# Patient Record
Sex: Female | Born: 1945 | Race: White | Hispanic: No
Health system: Southern US, Community
[De-identification: ages and names within clinical notes are randomized; demographics above are authoritative.]

---

## 2019-12-05 ENCOUNTER — Ambulatory Visit: Payer: Self-pay

## 2019-12-19 ENCOUNTER — Ambulatory Visit: Payer: Self-pay

## 2019-12-22 ENCOUNTER — Other Ambulatory Visit: Payer: Self-pay

## 2019-12-22 ENCOUNTER — Ambulatory Visit: Payer: Medicare Other | Attending: Internal Medicine

## 2019-12-22 DIAGNOSIS — Z23 Encounter for immunization: Secondary | ICD-10-CM

## 2019-12-22 NOTE — Progress Notes (Signed)
   Covid-19 Vaccination Clinic  Name:  Amy Wallace    MRN: [id number] DOB: [birth year]  12/22/2019  [name] was observed post Covid-19 immunization for 15 minutes without incident. She was provided with Vaccine Information Sheet and instruction to access the V-Safe system.   [name] was instructed to call 911 with any severe reactions post vaccine: Marland Kitchen Difficulty breathing  . Swelling of face and throat  . A fast heartbeat  . A bad rash all over body  . Dizziness and weakness   Immunizations Administered    Name Date Dose VIS Date Route   Pfizer COVID-19 Vaccine 12/22/2019 10:33 AM 0.3 mL 08/19/2019 Intramuscular   Manufacturer: [employer], [employer]   Lot: [id number]   NDC: [address]-9

## 2020-01-18 ENCOUNTER — Ambulatory Visit: Payer: Medicare Other

## 2020-01-18 ENCOUNTER — Ambulatory Visit: Payer: Medicare Other | Attending: Internal Medicine

## 2020-01-18 DIAGNOSIS — Z23 Encounter for immunization: Secondary | ICD-10-CM

## 2020-01-18 NOTE — Progress Notes (Signed)
   Covid-19 Vaccination Clinic  Name:  Amy Wallace    MRN: [id number] DOB: 11-01-1945  01/18/2020  [name] was observed post Covid-19 immunization for 15 minutes without incident. She was provided with Vaccine Information Sheet and instruction to access the V-Safe system.   [name] was instructed to call 911 with any severe reactions post vaccine: Marland Kitchen Difficulty breathing  . Swelling of face and throat  . A fast heartbeat  . A bad rash all over body  . Dizziness and weakness   Immunizations Administered    Name Date Dose VIS Date Route   Pfizer COVID-19 Vaccine 01/18/2020  2:17 PM 0.3 mL 11/02/2018 Intramuscular   Manufacturer: [employer], [employer]   Lot: [id number]   NDC: [address]-1

## 2020-06-05 ENCOUNTER — Other Ambulatory Visit: Payer: Self-pay | Admitting: Internal Medicine

## 2020-06-05 DIAGNOSIS — Z1231 Encounter for screening mammogram for malignant neoplasm of breast: Secondary | ICD-10-CM

## 2020-08-17 ENCOUNTER — Other Ambulatory Visit: Payer: Self-pay

## 2020-08-17 ENCOUNTER — Ambulatory Visit
Admission: RE | Admit: 2020-08-17 | Discharge: 2020-08-17 | Disposition: A | Discharge status: Home / Self Care | Payer: Medicare Other | Source: Ambulatory Visit | Attending: Internal Medicine | Admitting: Internal Medicine

## 2020-08-17 DIAGNOSIS — Z1231 Encounter for screening mammogram for malignant neoplasm of breast: Secondary | ICD-10-CM | POA: Diagnosis present

## 2021-05-27 ENCOUNTER — Other Ambulatory Visit: Payer: Self-pay | Admitting: Internal Medicine

## 2021-05-27 DIAGNOSIS — Z1231 Encounter for screening mammogram for malignant neoplasm of breast: Secondary | ICD-10-CM

## 2021-08-19 ENCOUNTER — Ambulatory Visit
Admission: RE | Admit: 2021-08-19 | Discharge: 2021-08-19 | Disposition: A | Discharge status: Home / Self Care | Payer: Medicare Other | Source: Ambulatory Visit | Attending: Internal Medicine | Admitting: Internal Medicine

## 2021-08-19 ENCOUNTER — Other Ambulatory Visit: Payer: Self-pay

## 2021-08-19 DIAGNOSIS — Z1231 Encounter for screening mammogram for malignant neoplasm of breast: Secondary | ICD-10-CM | POA: Diagnosis not present

## 2022-07-14 ENCOUNTER — Other Ambulatory Visit: Payer: Self-pay | Admitting: Internal Medicine

## 2022-07-14 DIAGNOSIS — Z1231 Encounter for screening mammogram for malignant neoplasm of breast: Secondary | ICD-10-CM

## 2022-08-27 ENCOUNTER — Ambulatory Visit
Admission: RE | Admit: 2022-08-27 | Discharge: 2022-08-27 | Disposition: A | Discharge status: Home / Self Care | Payer: Medicare Other | Source: Ambulatory Visit | Attending: Internal Medicine | Admitting: Internal Medicine

## 2022-08-27 DIAGNOSIS — Z1231 Encounter for screening mammogram for malignant neoplasm of breast: Secondary | ICD-10-CM | POA: Diagnosis present

## 2023-03-19 IMAGING — MG MM DIGITAL SCREENING BILAT W/ TOMO AND CAD
8 series · 9 of 24 positions shown · non-contrast
Comparison: Previous exam(s).

CLINICAL DATA: Screening.

EXAM:
DIGITAL SCREENING BILATERAL MAMMOGRAM WITH TOMOSYNTHESIS AND CAD
TECHNIQUE: Bilateral screening digital craniocaudal and mediolateral oblique
mammograms were obtained. Bilateral screening digital breast
tomosynthesis was performed. The images were evaluated with
computer-aided detection.

[R MLO synth-2D]
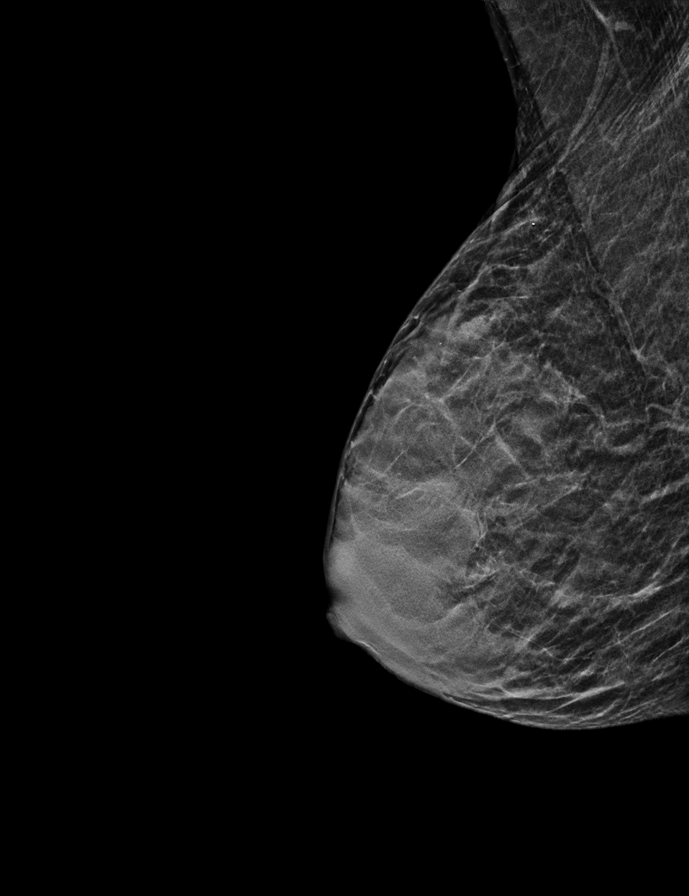

[L MLO synth-2D]
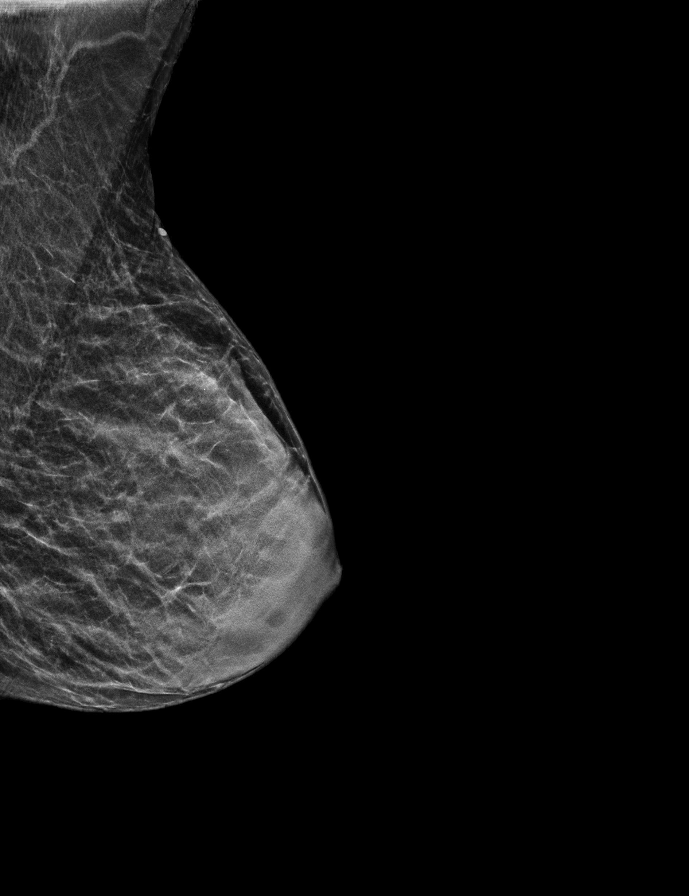

[R CC synth-2D]
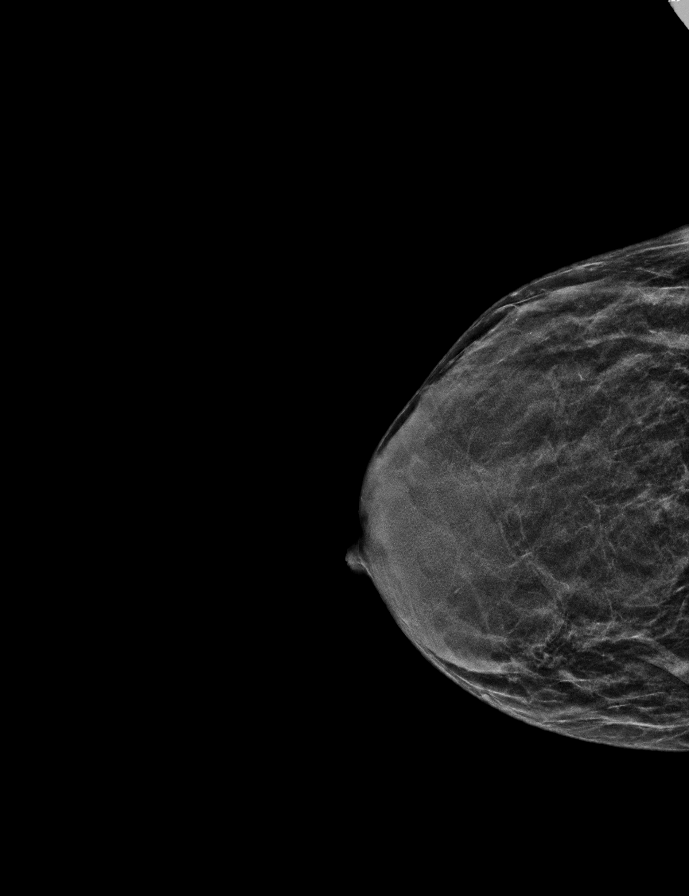

[L CC synth-2D]
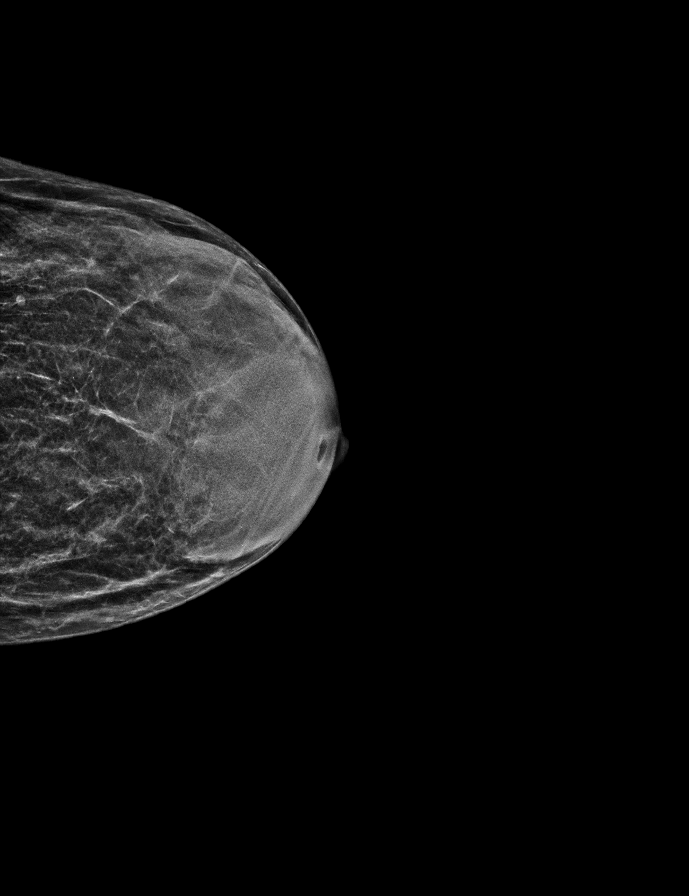

[L CC tomo · 2 of 30 frames shown]
[frame 10/30]
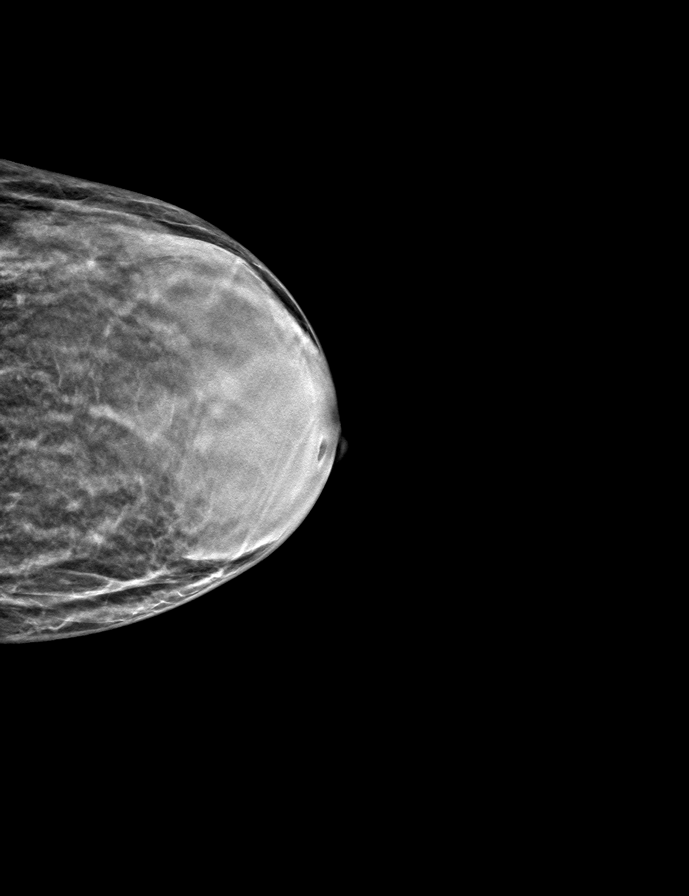
[frame 15/30]
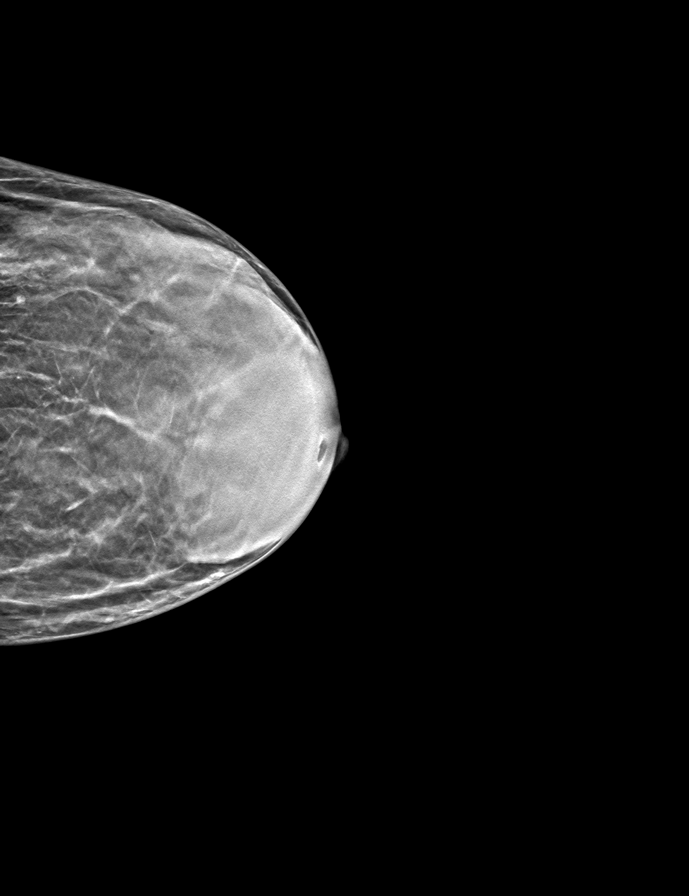

[R MLO tomo · tomo slice 15/29.0]
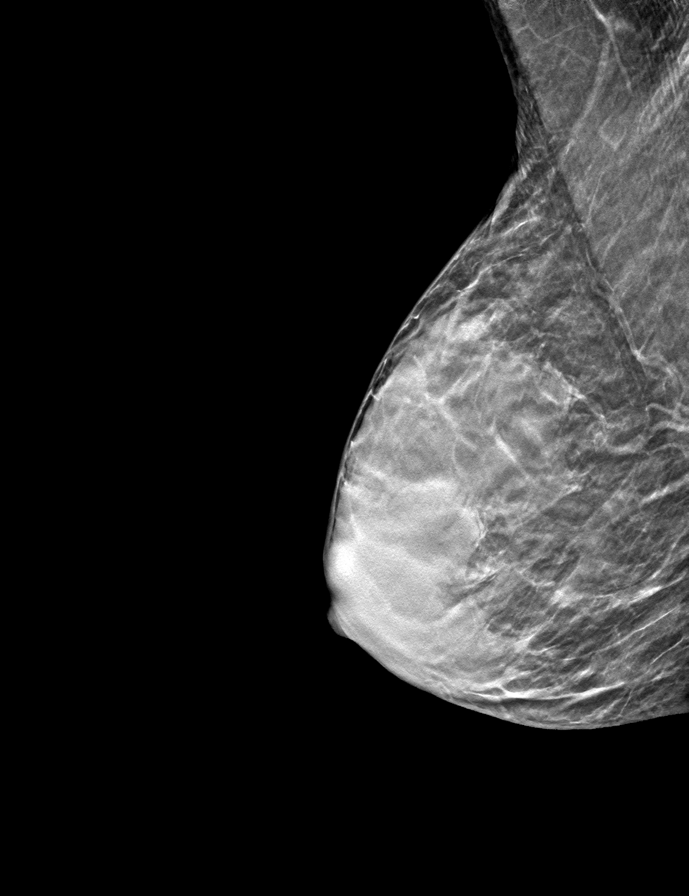

[R CC tomo · tomo slice 15/30.0]
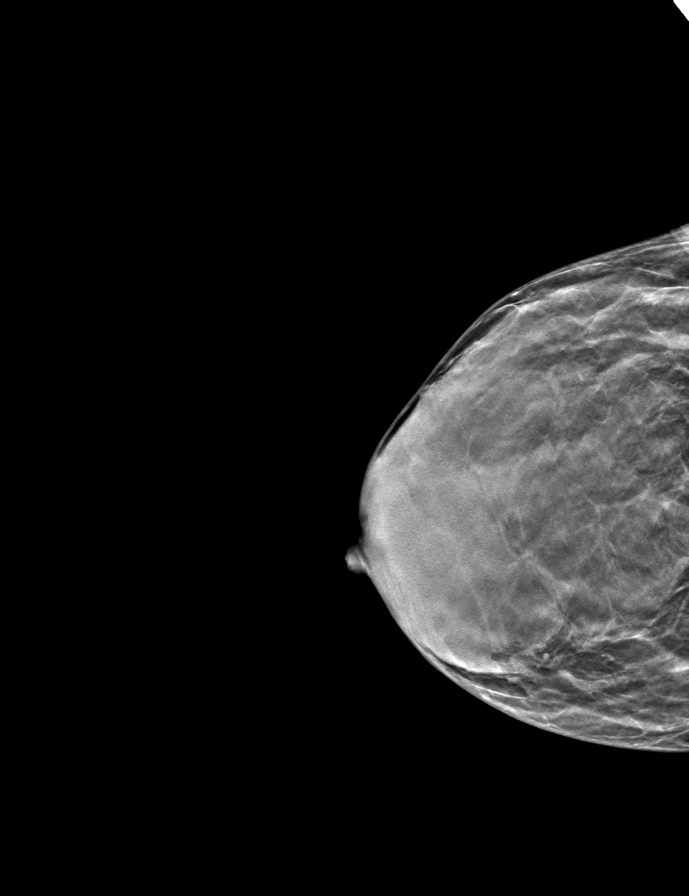

[L MLO tomo · tomo slice 17/33.0]
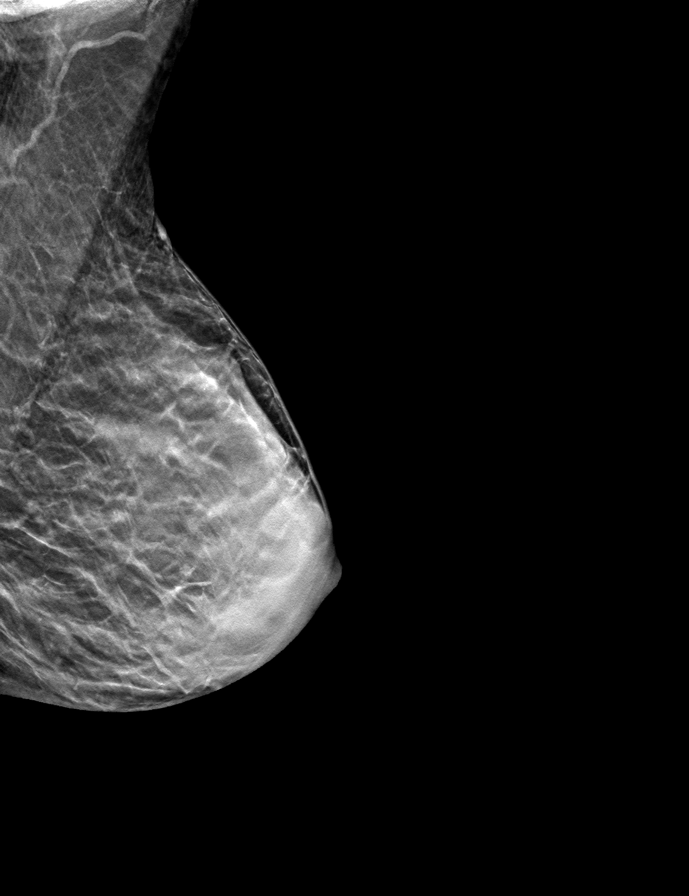

[9 of 24 positions shown; findings below may reference images not displayed]

ACR Breast Density Category c: The breast tissue is heterogeneously
dense, which may obscure small masses.
FINDINGS: There are no findings suspicious for malignancy.
IMPRESSION: No mammographic evidence of malignancy. A result letter of this
screening mammogram will be mailed directly to the patient.

RECOMMENDATION:
Screening mammogram in one year. (Code:[id number])

BI-RADS CATEGORY  1: Negative.

## 2023-06-12 ENCOUNTER — Other Ambulatory Visit: Payer: Self-pay | Admitting: Internal Medicine

## 2023-06-12 DIAGNOSIS — Z1231 Encounter for screening mammogram for malignant neoplasm of breast: Secondary | ICD-10-CM

## 2023-08-31 ENCOUNTER — Ambulatory Visit
Admission: RE | Admit: 2023-08-31 | Discharge: 2023-08-31 | Disposition: A | Discharge status: Home / Self Care | Payer: Medicare Other | Source: Ambulatory Visit | Attending: Internal Medicine | Admitting: Internal Medicine

## 2023-08-31 DIAGNOSIS — Z1231 Encounter for screening mammogram for malignant neoplasm of breast: Secondary | ICD-10-CM | POA: Diagnosis present

## 2024-05-23 ENCOUNTER — Other Ambulatory Visit: Payer: Self-pay | Admitting: Internal Medicine

## 2024-05-23 DIAGNOSIS — Z1231 Encounter for screening mammogram for malignant neoplasm of breast: Secondary | ICD-10-CM

## 2024-08-01 ENCOUNTER — Other Ambulatory Visit: Payer: Self-pay | Admitting: Internal Medicine

## 2024-08-01 DIAGNOSIS — Z1231 Encounter for screening mammogram for malignant neoplasm of breast: Secondary | ICD-10-CM

## 2024-09-05 ENCOUNTER — Ambulatory Visit
Admission: RE | Admit: 2024-09-05 | Discharge: 2024-09-05 | Disposition: A | Source: Ambulatory Visit | Attending: Internal Medicine | Admitting: Internal Medicine

## 2024-09-05 DIAGNOSIS — Z1231 Encounter for screening mammogram for malignant neoplasm of breast: Secondary | ICD-10-CM | POA: Diagnosis present
